# Patient Record
Sex: Male | Born: 1970 | Race: Black or African American | Hispanic: No | Marital: Married | Smoking: Never smoker
Health system: Southern US, Community
[De-identification: ages and names within clinical notes are randomized; demographics above are authoritative.]

## PROBLEM LIST (undated history)

## (undated) HISTORY — PX: HERNIA REPAIR: SHX51

---
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
## 2016-08-07 ENCOUNTER — Emergency Department (HOSPITAL_COMMUNITY)
Admission: EM | Admit: 2016-08-07 | Discharge: 2016-08-07 | Disposition: A | Discharge status: Home / Self Care | Payer: Self-pay | Attending: Emergency Medicine | Admitting: Emergency Medicine

## 2016-08-07 ENCOUNTER — Encounter (HOSPITAL_COMMUNITY): Payer: Self-pay | Admitting: Emergency Medicine

## 2016-08-07 ENCOUNTER — Emergency Department (HOSPITAL_COMMUNITY): Payer: Self-pay

## 2016-08-07 DIAGNOSIS — K4090 Unilateral inguinal hernia, without obstruction or gangrene, not specified as recurrent: Secondary | ICD-10-CM | POA: Insufficient documentation

## 2016-08-07 MED ORDER — FENTANYL CITRATE (PF) 100 MCG/2ML IJ SOLN
100.0000 ug | Freq: Once | INTRAMUSCULAR | Status: AC
  Administered 2016-08-07: 100 ug via INTRAVENOUS
  Filled 2016-08-07: qty 2

## 2016-08-07 MED ORDER — HYDROCODONE-ACETAMINOPHEN 5-325 MG PO TABS: 1.0000 | ORAL_TABLET | Freq: Four times a day (QID) | ORAL | 0 refills | Status: DC | PRN

## 2016-08-07 MED ORDER — IOPAMIDOL (ISOVUE-300) INJECTION 61%
INTRAVENOUS | Status: AC
  Administered 2016-08-07: 100 mL via INTRAVENOUS
  Filled 2016-08-07: qty 100

## 2016-08-07 MED ORDER — DOCUSATE SODIUM 100 MG PO CAPS: 100.0000 mg | ORAL_CAPSULE | Freq: Two times a day (BID) | ORAL | 0 refills | Status: DC

## 2016-08-07 NOTE — ED Triage Notes (Signed)
Pt complaining of right groin pain. Denies N/V/D. Pt states he had hernia surgery in 1987 on his left groin. He feels like the pain is the same as when he had a hernia. 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

## 2016-08-08 NOTE — ED Provider Notes (Signed)
[facility] DEPT Provider Note   CSN: [id number] Arrival date & time: 08/07/16  1839     History   Chief Complaint No chief complaint on file.   HPI Jonathan Norton is a 46 y.o. male.   Abdominal Pain   This is a new problem. The current episode started more than 2 days ago. The problem occurs constantly. Associated with: heavy lifting. The pain is located in the RLQ.    History reviewed. No pertinent past medical history.  There are no active problems to display for this patient.   Past Surgical History:  Procedure Laterality Date  . HERNIA REPAIR         Home Medications    Prior to Admission medications   Medication Sig Start Date End Date Taking? Authorizing Provider  docusate sodium (COLACE) 100 MG capsule Take 1 capsule (100 mg total) by mouth every 12 (twelve) hours. 08/07/16   Brooklinn Longbottom, Corene Cornea, MD  HYDROcodone-acetaminophen (NORCO/VICODIN) 5-325 MG tablet Take 1 tablet by mouth every 6 (six) hours as needed for severe pain. 08/07/16   Salayah Meares, Corene Cornea, MD    Family History History reviewed. No pertinent family history.  Social History Social History  Substance Use Topics  . Smoking status: Never Smoker  . Smokeless tobacco: Never Used  . Alcohol use No     Allergies   Patient has no known allergies.   Review of Systems Review of Systems  Gastrointestinal: Positive for abdominal pain.  All other systems reviewed and are negative.    Physical Exam Updated Vital Signs BP 125/70   Pulse 69   Temp 98.5 F (36.9 C) (Oral)   Resp 16   Ht 5\' 10"  (1.778 m)   SpO2 92%   Physical Exam  Constitutional: He is oriented to person, place, and time. He appears well-developed and well-nourished.  HENT:  Head: Normocephalic and atraumatic.  Eyes: Conjunctivae and EOM are normal.  Neck: Normal range of motion.  Cardiovascular: Normal rate.   Pulmonary/Chest: Effort normal. No respiratory distress.  Abdominal: Soft. He exhibits no distension.    Musculoskeletal: Normal range of motion. He exhibits no edema or deformity.  Neurological: He is alert and oriented to person, place, and time. No cranial nerve deficit. Coordination normal.  Nursing note and vitals reviewed.    ED Treatments / Results  Labs (all labs ordered are listed, but only abnormal results are displayed) Labs Reviewed - No data to display  EKG  EKG Interpretation None       Radiology Ct Abdomen Pelvis W Contrast  Result Date: 08/07/2016 CLINICAL DATA:  Acute onset of right groin pain.  Initial encounter. EXAM: CT ABDOMEN AND PELVIS WITH CONTRAST TECHNIQUE: Multidetector CT imaging of the abdomen and pelvis was performed using the standard protocol following bolus administration of intravenous contrast. CONTRAST:  172mL ISOVUE-300 IOPAMIDOL (ISOVUE-300) INJECTION 61% COMPARISON:  None. FINDINGS: Lower chest: A small left pleural effusion is noted. Mild left basilar airspace opacity could reflect mild pneumonia. The visualized portions of the thyroid gland are unremarkable. Hepatobiliary: The liver is unremarkable in appearance. The gallbladder is unremarkable in appearance. The common bile duct remains normal in caliber. Pancreas: The pancreas is within normal limits. Spleen: The spleen is unremarkable in appearance. Adrenals/Urinary Tract: The adrenal glands are unremarkable in appearance. The kidneys are within normal limits. There is no evidence of hydronephrosis. No renal or ureteral stones are identified. No perinephric stranding is seen. Stomach/Bowel: The stomach is unremarkable in appearance. The small bowel is within normal  limits. The appendix is normal in caliber, without evidence of appendicitis. The colon is unremarkable in appearance. Vascular/Lymphatic: The abdominal aorta is unremarkable in appearance. The inferior vena cava is grossly unremarkable. No retroperitoneal lymphadenopathy is seen. No pelvic sidewall lymphadenopathy is identified. Reproductive:  The bladder is mildly distended and grossly remarkable. The prostate remains normal in size. Other: A small right inguinal hernia is noted, containing only fat. Musculoskeletal: No acute osseous abnormalities are identified. The visualized musculature is unremarkable in appearance. IMPRESSION: 1. Small right inguinal hernia, containing only fat. 2. Small left pleural effusion noted. Mild left basilar airspace opacity could reflect mild pneumonia. Would correlate for any associated symptoms. Electronically Signed   By: Garald Balding M.D.   On: 08/07/2016 23:03    Procedures Procedures (including critical care time)  Medications Ordered in ED Medications  fentaNYL (SUBLIMAZE) injection 100 mcg (100 mcg Intravenous Given 08/07/16 2200)  iopamidol (ISOVUE-300) 61 % injection (100 mLs Intravenous Contrast Given 08/07/16 2237)     Initial Impression / Assessment and Plan / ED Course  I have reviewed the triage vital signs and the nursing notes.  Pertinent labs & imaging results that were available during my care of the patient were reviewed by me and considered in my medical decision making (see chart for details).     New right inguinal hernia, no concern for incarceration or obstruction currently. Will have surgery follow up, short course of pain meds until then.   Final Clinical Impressions(s) / ED Diagnoses   Final diagnoses:  Non-recurrent unilateral inguinal hernia without obstruction or gangrene    New Prescriptions Discharge Medication List as of 08/07/2016 11:14 PM    START taking these medications   Details  docusate sodium (COLACE) 100 MG capsule Take 1 capsule (100 mg total) by mouth every 12 (twelve) hours., Starting Mon 08/07/2016, Print    HYDROcodone-acetaminophen (NORCO/VICODIN) 5-325 MG tablet Take 1 tablet by mouth every 6 (six) hours as needed for severe pain., Starting Mon 08/07/2016, Print         Shanley Furlough, Corene Cornea, MD 08/08/16 0040

## 2018-10-20 IMAGING — CT CT ABD-PELV W/ CM
2 of 5 series · 16 of 46 positions shown, 18 images · IV contrast (iopamidol)
Comparison: None.

CLINICAL DATA: Acute onset of right groin pain.  Initial encounter.

EXAM:
CT ABDOMEN AND PELVIS WITH CONTRAST
TECHNIQUE: Multidetector CT imaging of the abdomen and pelvis was performed
using the standard protocol following bolus administration of
intravenous contrast.
CONTRAST:  100mL JKMGA9-0ZZ IOPAMIDOL (JKMGA9-0ZZ) INJECTION 61%

[Series 3: abd/ pelvis 5.0 i30f 2 · axial · 0.86mm/px · z∈[-410,+30]mm · 13 of 98 slices shown, 15 images]
[im 5/98  soft-tissue]
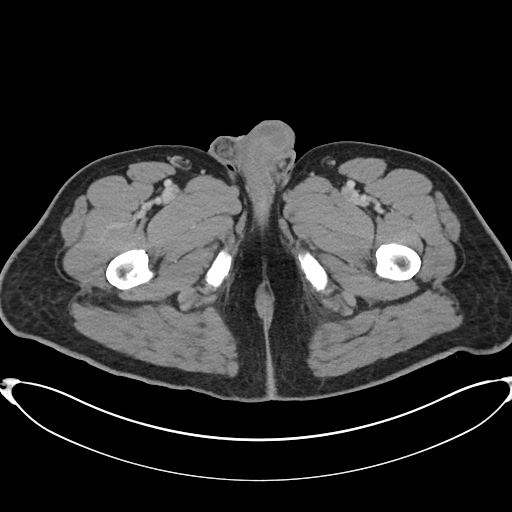
[im 5/98  bone]
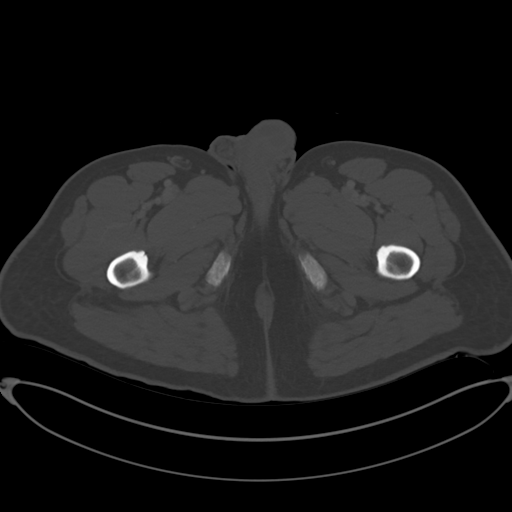
[im 14/98  soft-tissue]
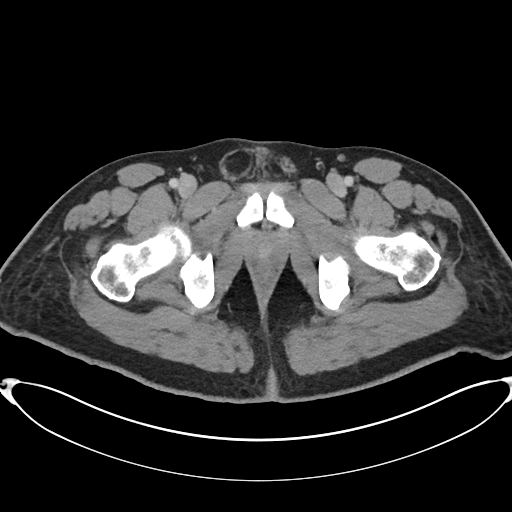
[im 23/98  soft-tissue]
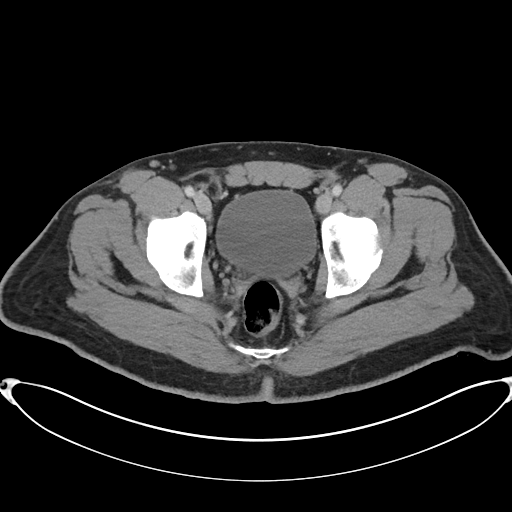
[im 27/98  soft-tissue]
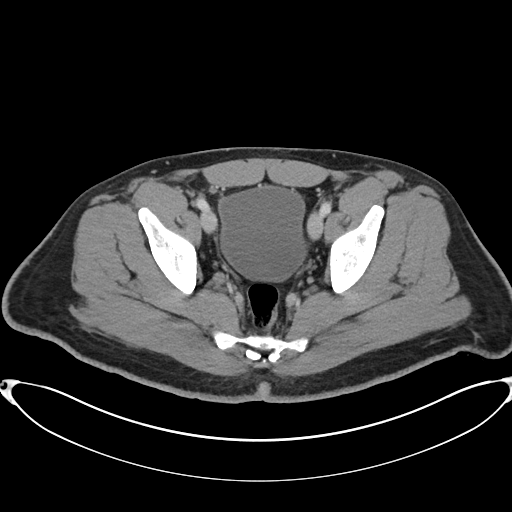
[im 36/98  soft-tissue]
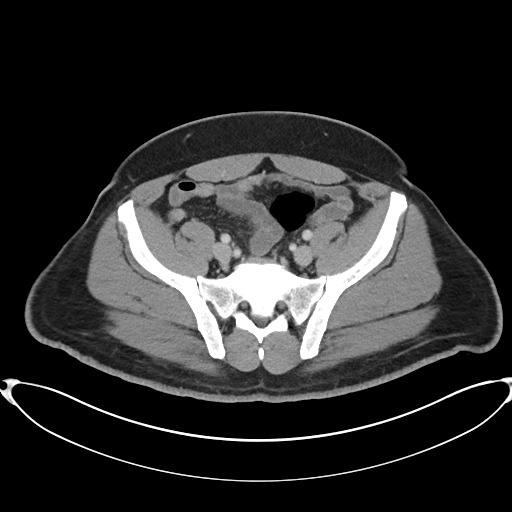
[im 40/98  soft-tissue]
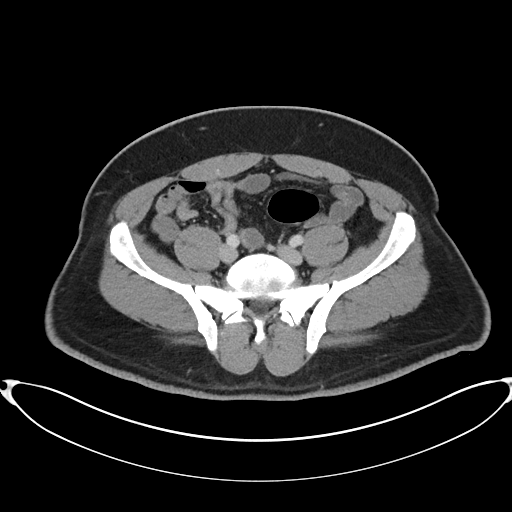
[im 49/98  soft-tissue]
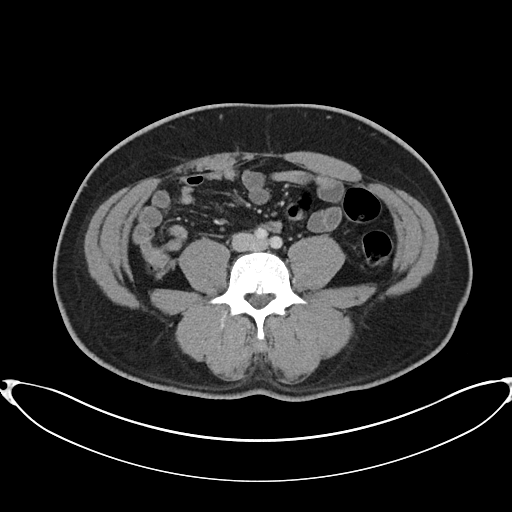
[im 58/98  soft-tissue]
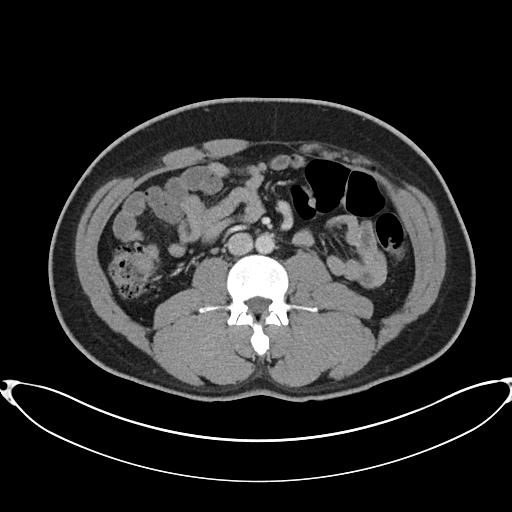
[im 62/98  soft-tissue]
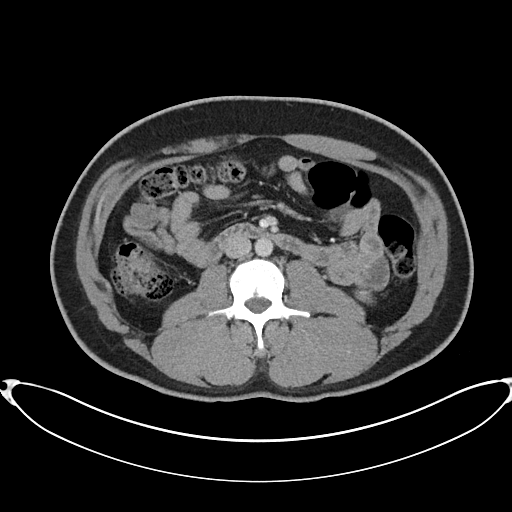
[im 62/98  bone]
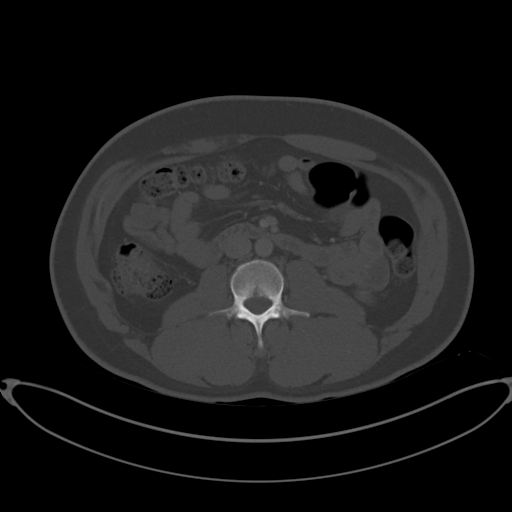
[im 71/98  soft-tissue]
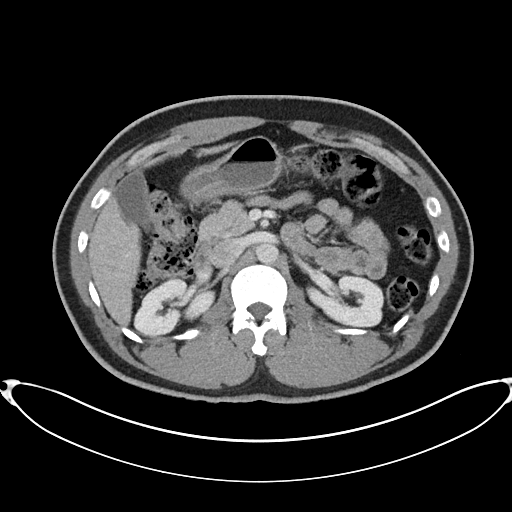
[im 75/98  soft-tissue]
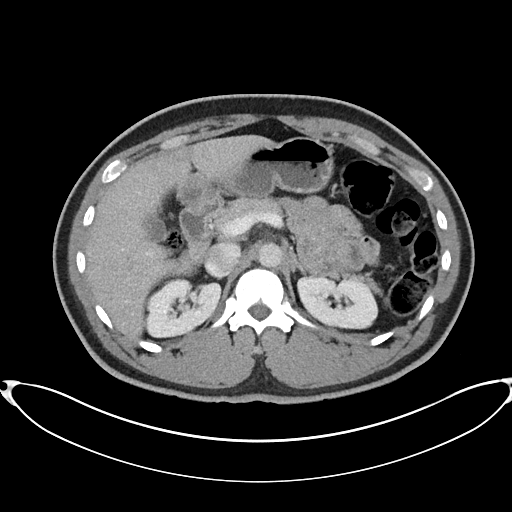
[im 84/98  soft-tissue]
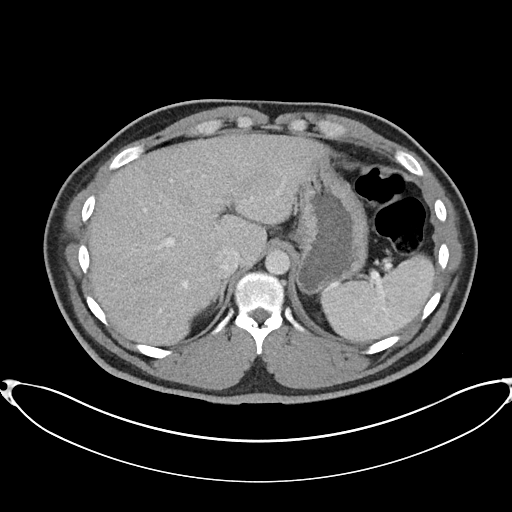
[im 93/98  soft-tissue]
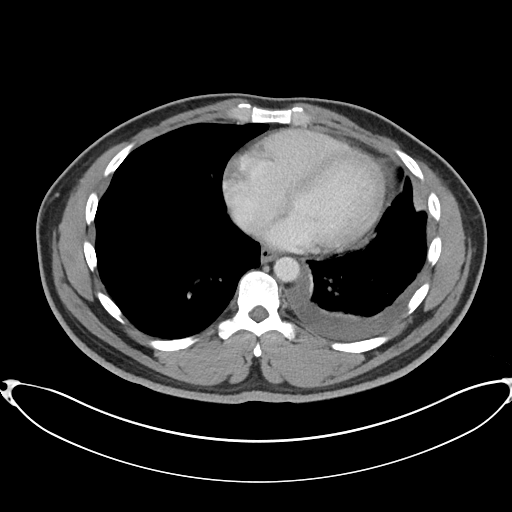

[Series 6: coronal soft tissue · coronal · 0.95mm/px · 3 of 98 slices shown]
[im 33/98  soft-tissue]
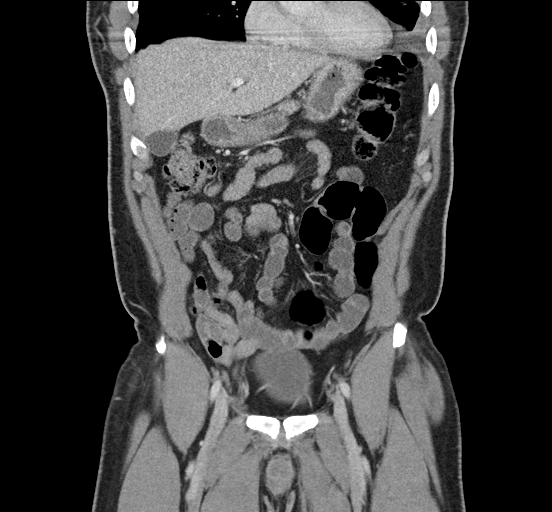
[im 44/98  soft-tissue]
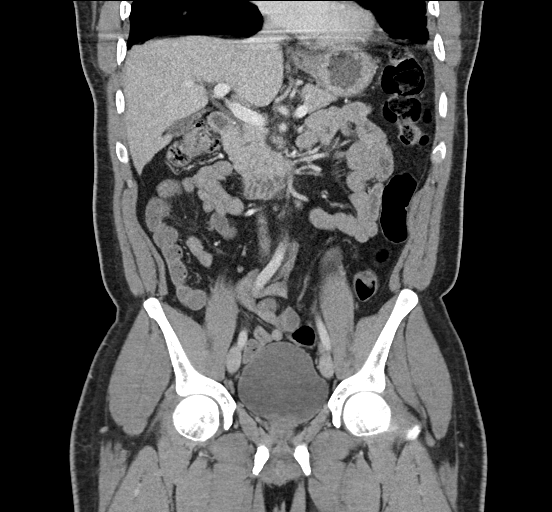
[im 54/98  soft-tissue]
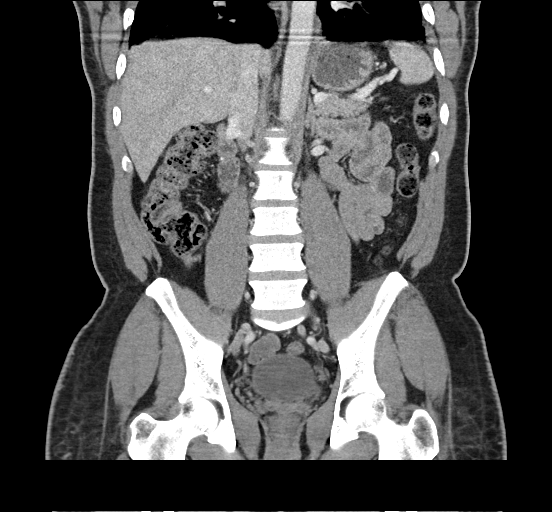

[16 of 46 positions shown; findings below may reference images not displayed]

FINDINGS: Lower chest: A small left pleural effusion is noted. Mild left
basilar airspace opacity could reflect mild pneumonia. The
visualized portions of the thyroid gland are unremarkable.

Hepatobiliary: The liver is unremarkable in appearance. The
gallbladder is unremarkable in appearance. The common bile duct
remains normal in caliber.

Pancreas: The pancreas is within normal limits.

Spleen: The spleen is unremarkable in appearance.

Adrenals/Urinary Tract: The adrenal glands are unremarkable in
appearance. The kidneys are within normal limits. There is no
evidence of hydronephrosis. No renal or ureteral stones are
identified. No perinephric stranding is seen.

Stomach/Bowel: The stomach is unremarkable in appearance. The small
bowel is within normal limits. The appendix is normal in caliber,
without evidence of appendicitis. The colon is unremarkable in
appearance.

Vascular/Lymphatic: The abdominal aorta is unremarkable in
appearance. The inferior vena cava is grossly unremarkable. No
retroperitoneal lymphadenopathy is seen. No pelvic sidewall
lymphadenopathy is identified.

Reproductive: The bladder is mildly distended and grossly
remarkable. The prostate remains normal in size.

Other: A small right inguinal hernia is noted, containing only fat.

Musculoskeletal: No acute osseous abnormalities are identified. The
visualized musculature is unremarkable in appearance.
IMPRESSION: 1. Small right inguinal hernia, containing only fat.
2. Small left pleural effusion noted. Mild left basilar airspace
opacity could reflect mild pneumonia. Would correlate for any
associated symptoms.

## 2019-01-03 ENCOUNTER — Other Ambulatory Visit: Payer: Self-pay

## 2019-01-03 DIAGNOSIS — Z20822 Contact with and (suspected) exposure to covid-19: Secondary | ICD-10-CM

## 2019-01-06 LAB — NOVEL CORONAVIRUS, NAA: SARS-CoV-2, NAA: NOT DETECTED

## 2019-01-31 ENCOUNTER — Other Ambulatory Visit: Payer: Self-pay | Admitting: Obstetrics and Gynecology

## 2019-01-31 ENCOUNTER — Ambulatory Visit
Admission: RE | Admit: 2019-01-31 | Discharge: 2019-01-31 | Disposition: A | Discharge status: Home / Self Care | Payer: No Typology Code available for payment source | Source: Ambulatory Visit | Attending: Obstetrics and Gynecology | Admitting: Obstetrics and Gynecology

## 2019-01-31 DIAGNOSIS — R7611 Nonspecific reaction to tuberculin skin test without active tuberculosis: Secondary | ICD-10-CM

## 2019-05-08 ENCOUNTER — Ambulatory Visit: Payer: No Typology Code available for payment source | Attending: Internal Medicine

## 2019-05-08 DIAGNOSIS — Z23 Encounter for immunization: Secondary | ICD-10-CM

## 2019-05-08 NOTE — Progress Notes (Signed)
   Covid-19 Vaccination Clinic  Name:  Jonathan Norton    MRN: [id number] DOB: 15-Oct-1970  05/08/2019  Jonathan Norton was observed post Covid-19 immunization for 15 minutes without incident. He was provided with Vaccine Information Sheet and instruction to access the V-Safe system.   Jonathan Norton was instructed to call 911 with any severe reactions post vaccine: Marland Kitchen Difficulty breathing  . Swelling of face and throat  . A fast heartbeat  . A bad rash all over body  . Dizziness and weakness   Immunizations Administered    Name Date Dose VIS Date Route   Pfizer COVID-19 Vaccine 05/08/2019  2:04 PM 0.3 mL 01/31/2019 Intramuscular   Manufacturer: [place]   Lot: [id number]   [place]: [id number]

## 2019-06-02 ENCOUNTER — Ambulatory Visit: Payer: No Typology Code available for payment source | Attending: Internal Medicine

## 2019-06-02 DIAGNOSIS — Z23 Encounter for immunization: Secondary | ICD-10-CM

## 2019-06-02 NOTE — Progress Notes (Signed)
   Covid-19 Vaccination Clinic  Name:  Jonathan Norton    MRN: [id number] DOB: 1970-12-23  06/02/2019  Mr. Jonathan Norton was observed post Covid-19 immunization for 15 minutes without incident. He was provided with Vaccine Information Sheet and instruction to access the V-Safe system.   Mr. Jonathan Norton was instructed to call 911 with any severe reactions post vaccine: Marland Kitchen Difficulty breathing  . Swelling of face and throat  . A fast heartbeat  . A bad rash all over body  . Dizziness and weakness   Immunizations Administered    Name Date Dose VIS Date Route   Pfizer COVID-19 Vaccine 06/02/2019  2:00 PM 0.3 mL 01/31/2019 Intramuscular   Manufacturer: [place]   Lot: [id number]   [place]: [id number]

## 2021-04-14 IMAGING — CR DG CHEST 2V
2 series · 2 of 2 positions shown · non-contrast
Comparison: None.

CLINICAL DATA: Positive PPD

EXAM:
CHEST - 2 VIEW

[w chest pa]
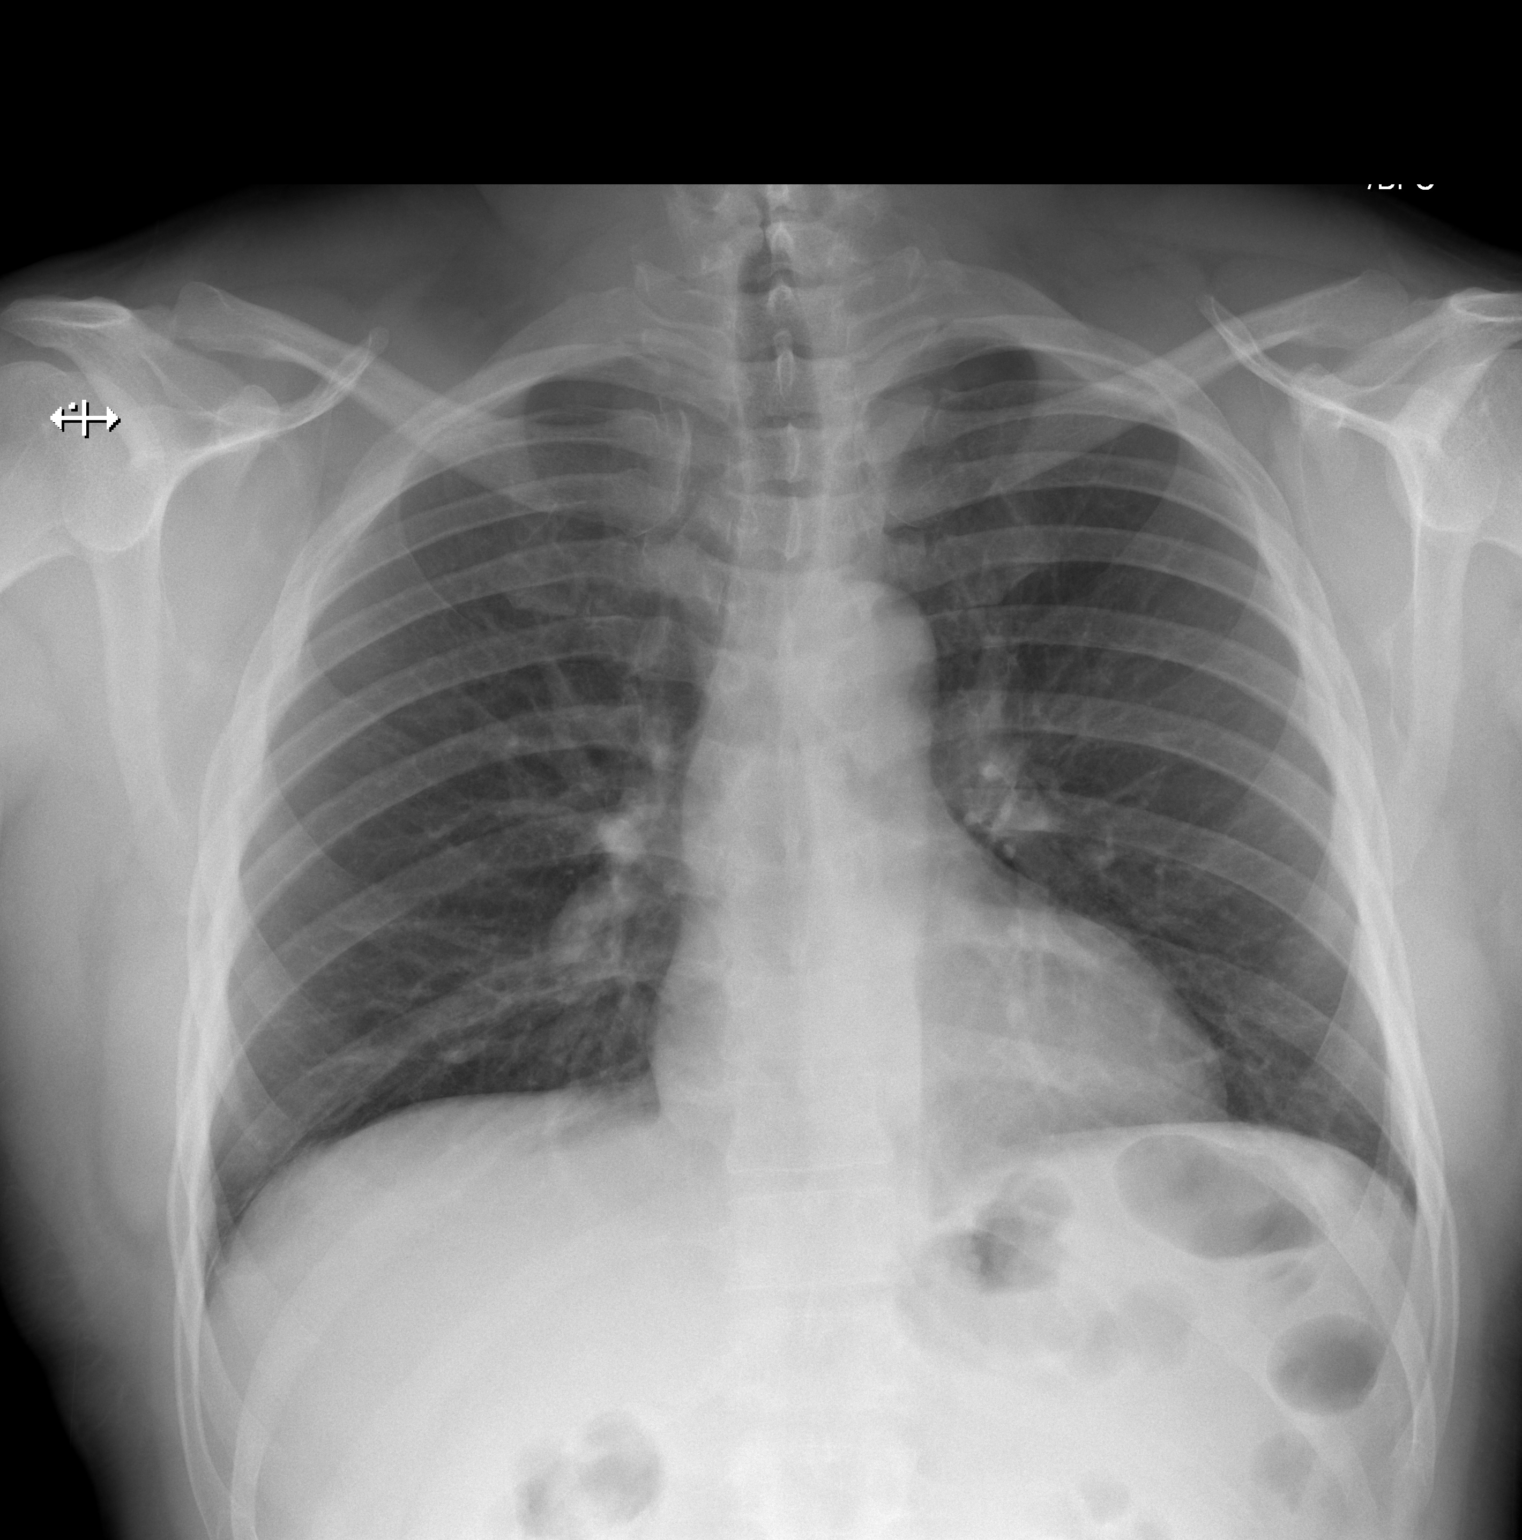

[w chest lat]
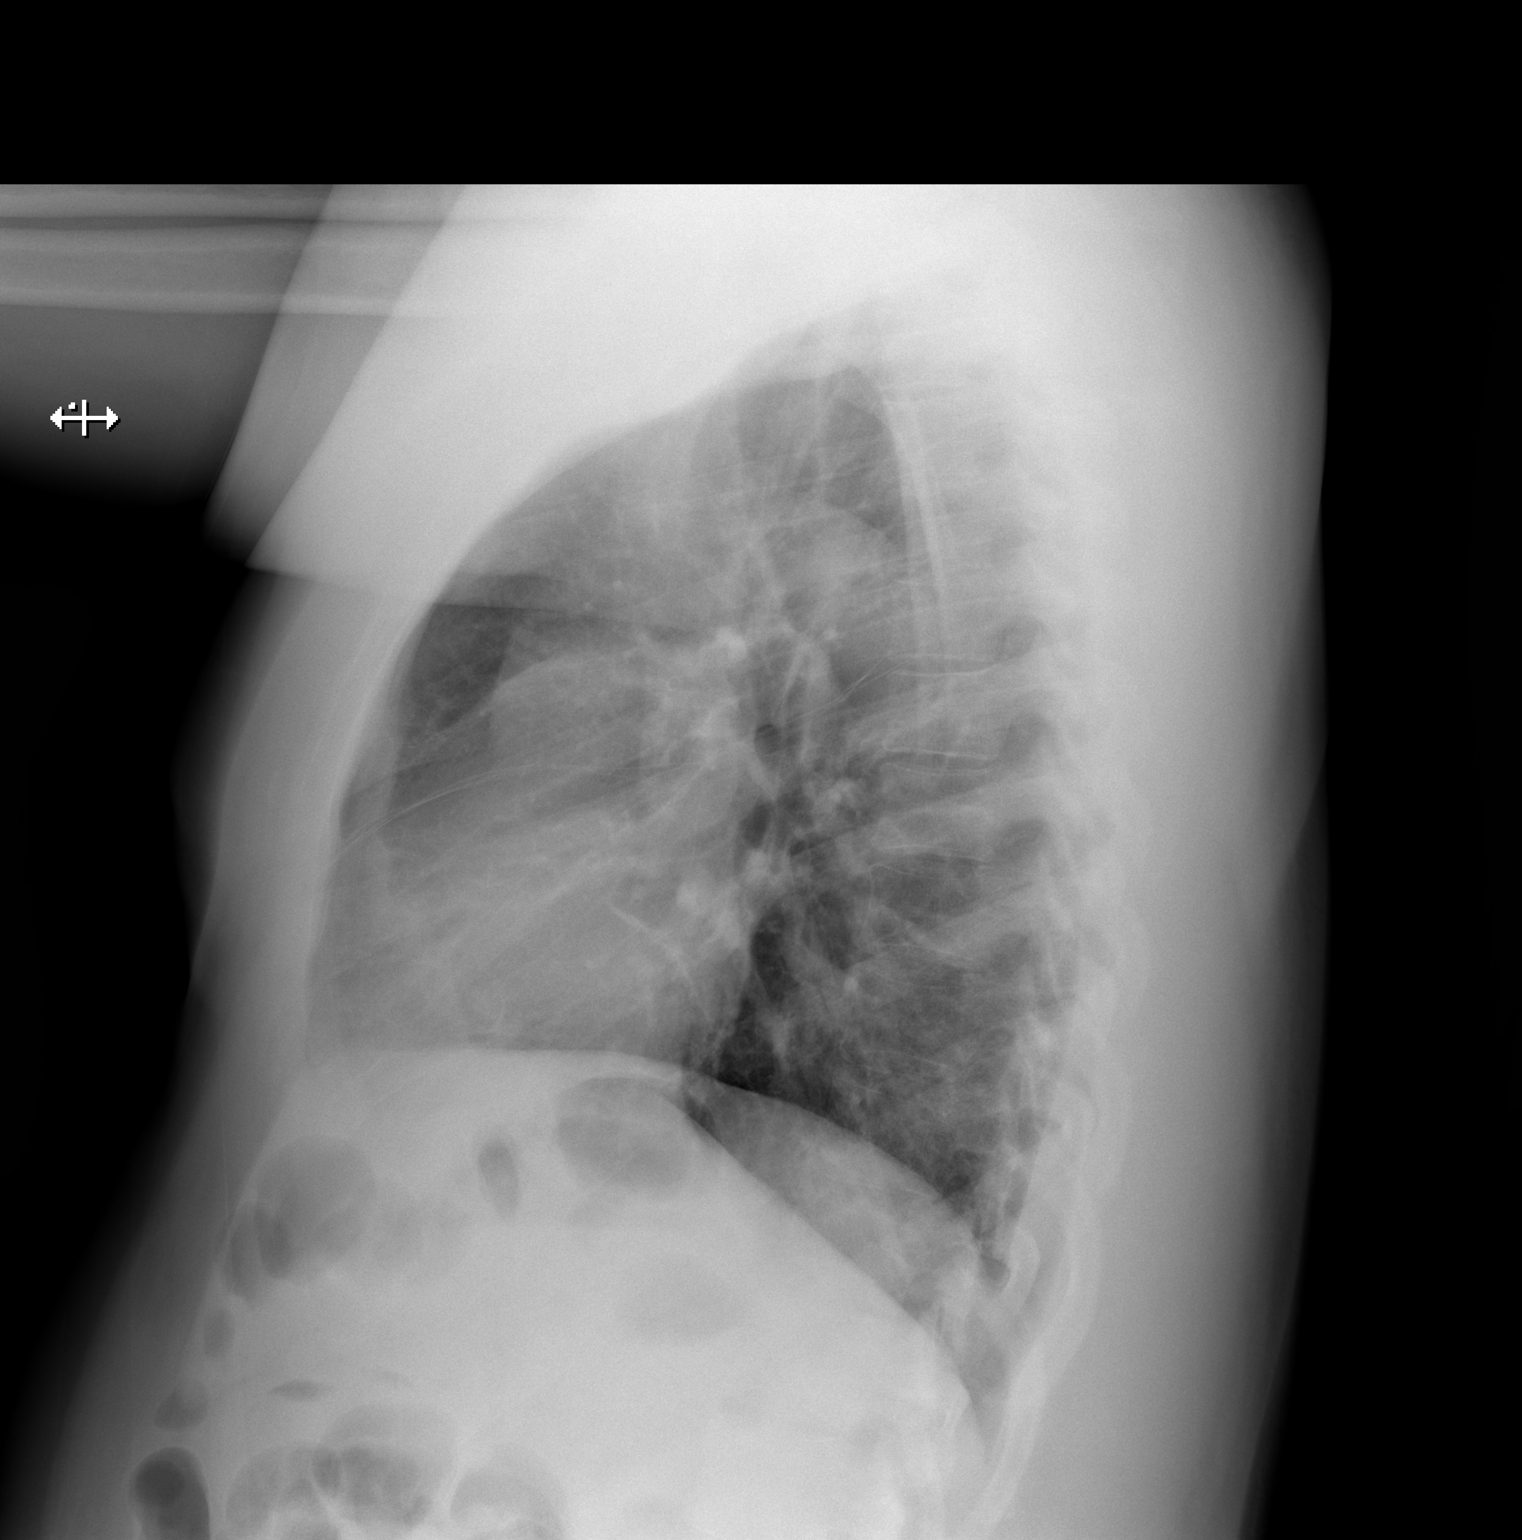

[2 of 2 positions shown; findings below may reference images not displayed]

FINDINGS: The heart size and mediastinal contours are within normal limits.
Both lungs are clear. The visualized skeletal structures are
unremarkable.
IMPRESSION: No acute abnormality of the lungs. No radiographic evidence of
pulmonary tuberculosis.

## 2021-05-05 ENCOUNTER — Encounter: Payer: Self-pay | Admitting: Gastroenterology

## 2021-05-23 ENCOUNTER — Ambulatory Visit (AMBULATORY_SURGERY_CENTER): Payer: 59

## 2021-05-23 ENCOUNTER — Other Ambulatory Visit: Payer: Self-pay

## 2021-05-23 VITALS — Ht 70.0 in | Wt 210.0 lb

## 2021-05-23 DIAGNOSIS — Z1211 Encounter for screening for malignant neoplasm of colon: Secondary | ICD-10-CM

## 2021-05-23 MED ORDER — PEG 3350-KCL-NA BICARB-NACL 420 G PO SOLR: 4000.0000 mL | Freq: Once | ORAL | 0 refills | Status: AC

## 2021-05-23 NOTE — Progress Notes (Signed)
No egg or soy allergy known to patient  ?No issues known to pt with past sedation with any surgeries or procedures ?Patient denies ever being told they had issues or difficulty with intubation  ?No FH of Malignant Hyperthermia ?Pt is not on diet pills ?Pt is not on  home 02  ?Pt is not on blood thinners  ?Pt denies issues with constipation;  ?No A fib or A flutter ?NO PA's for preps discussed with pt in PV today  ?Discussed with pt there will be an out-of-pocket cost for prep and that varies from $0 to 70 +  dollars - pt verbalized understanding  ?Due to the COVID-19 pandemic we are asking patients to follow certain guidelines in PV and the [facility]   ?Pt aware of COVID protocols and [facility] guidelines  ?PV completed over the phone. Pt verified name, DOB, address and insurance during PV today.  ?Pt mailed instruction packet with copy of consent form to read and not return, and instructions.  ?Pt encouraged to call with questions or issues.  ?If pt has My chart, procedure instructions sent via My Chart   ?

## 2021-05-25 ENCOUNTER — Encounter: Payer: Self-pay | Admitting: Gastroenterology

## 2021-06-06 ENCOUNTER — Ambulatory Visit (AMBULATORY_SURGERY_CENTER): Payer: 59 | Admitting: Gastroenterology

## 2021-06-06 ENCOUNTER — Encounter: Payer: Self-pay | Admitting: Gastroenterology

## 2021-06-06 VITALS — BP 111/63 | HR 81 | Temp 97.3°F | Resp 10 | Ht 70.0 in | Wt 210.0 lb

## 2021-06-06 DIAGNOSIS — Z1211 Encounter for screening for malignant neoplasm of colon: Secondary | ICD-10-CM | POA: Diagnosis present

## 2021-06-06 DIAGNOSIS — D124 Benign neoplasm of descending colon: Secondary | ICD-10-CM | POA: Diagnosis not present

## 2021-06-06 MED ORDER — SODIUM CHLORIDE 0.9 % IV SOLN: 500.0000 mL | Freq: Once | INTRAVENOUS | Status: DC

## 2021-06-06 NOTE — Patient Instructions (Signed)
Discharge instructions given. ?Handouts on polyps,diverticulosis and hemorrhoids. ?Resume previous medications. ?YOU HAD AN ENDOSCOPIC PROCEDURE TODAY AT [facility]:   Refer to the procedure report that was given to you for any specific questions about what was found during the examination.  If the procedure report does not answer your questions, please call your gastroenterologist to clarify.  If you requested that your care partner not be given the details of your procedure findings, then the procedure report has been included in a sealed envelope for you to review at your convenience later. ? ?YOU SHOULD EXPECT: Some feelings of bloating in the abdomen. Passage of more gas than usual.  Walking can help get rid of the air that was put into your GI tract during the procedure and reduce the bloating. If you had a lower endoscopy (such as a colonoscopy or flexible sigmoidoscopy) you may notice spotting of blood in your stool or on the toilet paper. If you underwent a bowel prep for your procedure, you may not have a normal bowel movement for a few days. ? ?Please Note:  You might notice some irritation and congestion in your nose or some drainage.  This is from the oxygen used during your procedure.  There is no need for concern and it should clear up in a day or so. ? ?SYMPTOMS TO REPORT IMMEDIATELY: ? ?Following lower endoscopy (colonoscopy or flexible sigmoidoscopy): ? Excessive amounts of blood in the stool ? Significant tenderness or worsening of abdominal pains ? Swelling of the abdomen that is new, acute ? Fever of 100?F or higher ? ? ? ?For urgent or emergent issues, a gastroenterologist can be reached at any hour by calling [phone]. ?Do not use MyChart messaging for urgent concerns.  ? ? ?DIET:  We do recommend a small meal at first, but then you may proceed to your regular diet.  Drink plenty of fluids but you should avoid alcoholic beverages for 24 hours. ? ?ACTIVITY:  You should  plan to take it easy for the rest of today and you should NOT DRIVE or use heavy machinery until tomorrow (because of the sedation medicines used during the test).   ? ?FOLLOW UP: ?Our staff will call the number listed on your records 48-72 hours following your procedure to check on you and address any questions or concerns that you may have regarding the information given to you following your procedure. If we do not reach you, we will leave a message.  We will attempt to reach you two times.  During this call, we will ask if you have developed any symptoms of COVID 19. If you develop any symptoms (ie: fever, flu-like symptoms, shortness of breath, cough etc.) before then, please call [phone].  If you test positive for Covid 19 in the 2 weeks post procedure, please call and report this information to [place].   ? ?If any biopsies were taken you will be contacted by phone or by letter within the next 1-3 weeks.  Please call us at [phone] if you have not heard about the biopsies in 3 weeks.  ? ? ?SIGNATURES/CONFIDENTIALITY: ?You and/or your care partner have signed paperwork which will be entered into your electronic medical record.  These signatures attest to the fact that that the information above on your After Visit Summary has been reviewed and is understood.  Full responsibility of the confidentiality of this discharge information lies with you and/or your care-partner.  ?

## 2021-06-06 NOTE — Progress Notes (Signed)
VS  DT ? ?Pt's states no medical or surgical changes since previsit or office visit. ? ?

## 2021-06-06 NOTE — Progress Notes (Signed)
[facility] History and Physical ? ? ?Primary Care Physician:  Patient, No Pcp Per (Inactive) ? ? ?Reason for Procedure:   Colon cancer screening ? ?Plan:    Screening colonoscopy ? ? ? ? ?HPI: Landrum Carbonell is a 51 y.o. male undergoing initial average risk screening colonoscopy.  He has no family history of colon cancer and no chronic GI symptoms.  ? ? ?History reviewed. No pertinent past medical history. ? ?Past Surgical History:  ?Procedure Laterality Date  ? INGUINAL HERNIA REPAIR Left 1988  ? ? ?Prior to Admission medications   ?Not on File  ? ? ?No current outpatient medications on file.  ? ?Current Facility-Administered Medications  ?Medication Dose Route Frequency Provider Last Rate Last Admin  ? 0.9 %  sodium chloride infusion  500 mL Intravenous Once Daryel November, MD      ? ? ?Allergies as of 06/06/2021  ? (No Known Allergies)  ? ? ?Family History  ?Problem Relation Age of Onset  ? Diabetes Mother   ? Hypertension Mother   ? Colon cancer Neg Hx   ? Colon polyps Neg Hx   ? Rectal cancer Neg Hx   ? Stomach cancer Neg Hx   ? Esophageal cancer Neg Hx   ? ? ?Social History  ? ?Socioeconomic History  ? Marital status: Married  ?  Spouse name: Not on file  ? Number of children: Not on file  ? Years of education: Not on file  ? Highest education level: Not on file  ?Occupational History  ? Not on file  ?Tobacco Use  ? Smoking status: Never  ? Smokeless tobacco: Never  ?Vaping Use  ? Vaping Use: Never used  ?Substance and Sexual Activity  ? Alcohol use: No  ? Drug use: No  ? Sexual activity: Not on file  ?Other Topics Concern  ? Not on file  ?Social History Narrative  ? Not on file  ? ?Social Determinants of Health  ? ?Financial Resource Strain: Not on file  ?Food Insecurity: Not on file  ?Transportation Needs: Not on file  ?Physical Activity: Not on file  ?Stress: Not on file  ?Social Connections: Not on file  ?Intimate Partner Violence: Not on file  ? ? ?Review of Systems: ? ?All other review  of systems negative except as mentioned in the HPI. ? ?Physical Exam: ?Vital signs ?BP 134/83   Pulse 67   Temp (!) 97.3 ?F (36.3 ?C)   Ht '5\' 10"'$  (1.778 m)   Wt 210 lb (95.3 kg)   SpO2 97%   BMI 30.13 kg/m?  ? ?General:   Alert,  Well-developed, well-nourished, pleasant and cooperative in NAD ?Airway:  Mallampati 2 ?Lungs:  Clear throughout to auscultation.   ?Heart:  Regular rate and rhythm; no murmurs, clicks, rubs,  or gallops. ?Abdomen:  Soft, nontender and nondistended. Normal bowel sounds.   ?Neuro/Psych:  Normal mood and affect. A and O x 3 ? ? ?Tanette Chauca E. Candis Schatz, MD ?[facility] ? ?

## 2021-06-06 NOTE — Op Note (Signed)
[facility] ?Patient Name: Jonathan Norton ?Procedure Date: 06/06/2021 1:44 PM ?MRN: [id number] ?Endoscopist: Laurel Harnden E. Candis Schatz , MD ?Age: 51 ?Referring MD:  ?Date of Birth: October 06, 1970 ?Gender: Male ?Account #: [id number] ?Procedure:                Colonoscopy ?Indications:              Screening for colorectal malignant neoplasm, This  ?                          is the patient's first colonoscopy ?Medicines:                Monitored Anesthesia Care ?Procedure:                Pre-Anesthesia Assessment: ?                          - Prior to the procedure, a History and Physical  ?                          was performed, and patient medications and  ?                          allergies were reviewed. The patient's tolerance of  ?                          previous anesthesia was also reviewed. The risks  ?                          and benefits of the procedure and the sedation  ?                          options and risks were discussed with the patient.  ?                          All questions were answered, and informed consent  ?                          was obtained. Prior Anticoagulants: The patient has  ?                          taken no previous anticoagulant or antiplatelet  ?                          agents. ASA Grade Assessment: II - A patient with  ?                          mild systemic disease. After reviewing the risks  ?                          and benefits, the patient was deemed in  ?                          satisfactory condition to undergo the procedure. ?  After obtaining informed consent, the colonoscope  ?                          was passed under direct vision. Throughout the  ?                          procedure, the patient's blood pressure, pulse, and  ?                          oxygen saturations were monitored continuously. The  ?                          Olympus CF-HQ190L (Serial# [id number]) Colonoscope was  ?                          introduced through the  anus and advanced to the the  ?                          cecum, identified by appendiceal orifice and  ?                          ileocecal valve. The colonoscopy was somewhat  ?                          difficult due to significant looping. Successful  ?                          completion of the procedure was aided by using  ?                          manual pressure. The patient tolerated the  ?                          procedure well. The quality of the bowel  ?                          preparation was good. The ileocecal valve,  ?                          appendiceal orifice, and rectum were photographed.  ?                          The bowel preparation used was GoLYTELY via split  ?                          dose instruction. ?Scope In: 2:09:49 PM ?Scope Out: 2:27:03 PM ?Scope Withdrawal Time: 0 hours 10 minutes 20 seconds  ?Total Procedure Duration: 0 hours 17 minutes 14 seconds  ?Findings:                 The perianal and digital rectal examinations were  ?                          normal. Pertinent negatives include normal  ?  sphincter tone and no palpable rectal lesions. ?                          A 4 mm polyp was found in the descending colon. The  ?                          polyp was sessile. The polyp was removed with a  ?                          cold snare. Resection and retrieval were complete. ?                          Multiple small and large-mouthed diverticula were  ?                          found in the ascending colon. ?                          The exam was otherwise normal throughout the  ?                          examined colon. ?                          Non-bleeding internal hemorrhoids were found during  ?                          retroflexion. The hemorrhoids were Grade I  ?                          (internal hemorrhoids that do not prolapse). ?                          No additional abnormalities were found on  ?                           retroflexion. ?Complications:            No immediate complications. ?Estimated Blood Loss:     Estimated blood loss was minimal. ?Impression:               - One 4 mm polyp in the descending colon, removed  ?                          with a cold snare. Resected and retrieved. ?                          - Diverticulosis in the ascending colon. ?                          - Non-bleeding internal hemorrhoids. ?Recommendation:           - Patient has a contact number available for  ?                          emergencies. The signs and symptoms of potential  ?  delayed complications were discussed with the  ?                          patient. Return to normal activities tomorrow.  ?                          Written discharge instructions were provided to the  ?                          patient. ?                          - Resume previous diet. ?                          - Continue present medications. ?                          - Await pathology results. ?                          - Repeat colonoscopy date to be determined after  ?                          pending pathology results are reviewed for  ?                          surveillance based on pathology results. ?Trequan Marsolek E. Candis Schatz, MD ?06/06/2021 2:33:42 PM ?This report has been signed electronically. ?

## 2021-06-06 NOTE — Progress Notes (Signed)
PT taken to PACU. Monitors in place. VSS. Report given to RN. 

## 2021-06-06 NOTE — Progress Notes (Signed)
Called to room to assist during endoscopic procedure.  Patient ID and intended procedure confirmed with present staff. Received instructions for my participation in the procedure from the performing physician.  

## 2021-06-08 ENCOUNTER — Telehealth: Payer: Self-pay

## 2021-06-08 NOTE — Telephone Encounter (Signed)
?  Follow up Call- ? ? ?  06/06/2021  ?  1:33 PM  ?Call back number  ?Post procedure Call Back phone  # [phone]  ?Permission to leave phone message Yes  ?  ? ?Patient questions: ? ?Do you have a fever, pain , or abdominal swelling? No. ?Pain Score  0 * ? ?Have you tolerated food without any problems? Yes.   ? ?Have you been able to return to your normal activities? Yes.   ? ?Do you have any questions about your discharge instructions: ?Diet   No. ?Medications  No. ?Follow up visit  No. ? ?Do you have questions or concerns about your Care? No. ? ?Actions: ?* If pain score is 4 or above: ?No action needed, pain <4. ? ? ?

## 2021-06-09 NOTE — Progress Notes (Signed)
Mr. Jonathan Norton,  ?The polyp which I removed during your recent procedure was proven to be completely benign but is considered a "pre-cancerous" polyp that MAY have grown into cancer if it had not been removed.  Studies shows that at least 20% of women over age 53 and 30% of men over age 93 have pre-cancerous polyps.  Based on current nationally recognized surveillance guidelines, I recommend that you have a repeat colonoscopy in 7 years.  ? ?If you develop any new rectal bleeding, abdominal pain or significant bowel habit changes, please contact me before then. ? ?

## 2021-09-27 DIAGNOSIS — E7800 Pure hypercholesterolemia, unspecified: Secondary | ICD-10-CM | POA: Diagnosis not present

## 2021-09-27 DIAGNOSIS — Z9189 Other specified personal risk factors, not elsewhere classified: Secondary | ICD-10-CM | POA: Diagnosis not present

## 2021-09-27 DIAGNOSIS — E6609 Other obesity due to excess calories: Secondary | ICD-10-CM | POA: Diagnosis not present

## 2021-09-27 DIAGNOSIS — J40 Bronchitis, not specified as acute or chronic: Secondary | ICD-10-CM | POA: Diagnosis not present

## 2021-11-01 DIAGNOSIS — E7800 Pure hypercholesterolemia, unspecified: Secondary | ICD-10-CM | POA: Diagnosis not present

## 2021-11-01 DIAGNOSIS — E6609 Other obesity due to excess calories: Secondary | ICD-10-CM | POA: Diagnosis not present

## 2021-11-01 DIAGNOSIS — Z9189 Other specified personal risk factors, not elsewhere classified: Secondary | ICD-10-CM | POA: Diagnosis not present

## 2021-11-21 DIAGNOSIS — Z23 Encounter for immunization: Secondary | ICD-10-CM | POA: Diagnosis not present

## 2022-01-11 DIAGNOSIS — E6609 Other obesity due to excess calories: Secondary | ICD-10-CM | POA: Diagnosis not present

## 2022-01-11 DIAGNOSIS — E7800 Pure hypercholesterolemia, unspecified: Secondary | ICD-10-CM | POA: Diagnosis not present

## 2022-01-11 DIAGNOSIS — Z9189 Other specified personal risk factors, not elsewhere classified: Secondary | ICD-10-CM | POA: Diagnosis not present

## 2022-01-11 DIAGNOSIS — J0100 Acute maxillary sinusitis, unspecified: Secondary | ICD-10-CM | POA: Diagnosis not present

## 2022-02-13 ENCOUNTER — Emergency Department (HOSPITAL_COMMUNITY)
Admission: EM | Admit: 2022-02-13 | Discharge: 2022-02-13 | Disposition: A | Discharge status: Home / Self Care | Payer: Self-pay | Attending: Emergency Medicine | Admitting: Emergency Medicine

## 2022-02-13 ENCOUNTER — Encounter (HOSPITAL_COMMUNITY): Payer: Self-pay

## 2022-02-13 ENCOUNTER — Other Ambulatory Visit: Payer: Self-pay

## 2022-02-13 DIAGNOSIS — K047 Periapical abscess without sinus: Secondary | ICD-10-CM | POA: Insufficient documentation

## 2022-02-13 MED ORDER — AMOXICILLIN-POT CLAVULANATE 875-125 MG PO TABS: 1.0000 | ORAL_TABLET | Freq: Two times a day (BID) | ORAL | 0 refills | Status: AC

## 2022-02-13 MED ORDER — IBUPROFEN 800 MG PO TABS
800.0000 mg | ORAL_TABLET | Freq: Once | ORAL | Status: AC
  Administered 2022-02-13: 800 mg via ORAL
  Filled 2022-02-13: qty 1

## 2022-02-13 MED ORDER — AMOXICILLIN-POT CLAVULANATE 875-125 MG PO TABS
1.0000 | ORAL_TABLET | Freq: Once | ORAL | Status: AC
  Administered 2022-02-13: 1 via ORAL
  Filled 2022-02-13: qty 1

## 2022-02-13 NOTE — ED Provider Notes (Addendum)
[place] EMERGENCY DEPARTMENT Provider Note   CSN: [id number] Arrival date & time: 02/13/22  1310     History  Chief Complaint  Patient presents with   Dental Pain    Jonathan Norton is a 51 y.o. male presents to the ED complaining of upper and lower right-sided dental pain for the past 3 days.  Patient states the pain and swelling is rating to his face and right ear.  Denies fever, chills, trouble swallowing, neck swelling, broken dentition.       Home Medications Prior to Admission medications   Medication Sig Start Date End Date Taking? Authorizing Provider  amoxicillin-clavulanate (AUGMENTIN) 875-125 MG tablet Take 1 tablet by mouth every 12 (twelve) hours. 02/13/22  Yes Theressa Stamps R, PA      Allergies    Patient has no known allergies.    Review of Systems   Review of Systems  Constitutional:  Negative for chills and fever.  HENT:  Positive for ear pain. Negative for mouth sores, sore throat and trouble swallowing.        Dental pain on the right side    Physical Exam Updated Vital Signs BP (!) 137/98 (BP Location: Right Arm)   Pulse 71   Temp 98.4 F (36.9 C) (Oral)   Resp 16   Ht '5\' 10"'$  (1.778 m)   SpO2 97%   BMI 30.13 kg/m  Physical Exam Vitals and nursing note reviewed.  Constitutional:      General: He is not in acute distress.    Appearance: Normal appearance. He is not ill-appearing or diaphoretic.  HENT:     Head: Normocephalic.     Right Ear: Tympanic membrane and ear canal normal.     Left Ear: Tympanic membrane and ear canal normal.     Mouth/Throat:     Mouth: Mucous membranes are moist. No oral lesions.     Dentition: Dental tenderness, gingival swelling, dental caries, dental abscesses and gum lesions present.     Tongue: No lesions.     Pharynx: Oropharynx is clear. No oropharyngeal exudate or posterior oropharyngeal erythema.     Comments: Redness and swelling to the gingiva near teeth 2 and 3 with suspected  periapical abscess.  Not currently draining, no bleeding.  Minimal swelling to patient's right face.  No broken dentition.  Dental caries are present.   Eyes:     General: Lids are normal.     Extraocular Movements: Extraocular movements intact.  Neck:     Trachea: Trachea and phonation normal.     Comments: No submandibular swelling to suggest Ludwig's angina.  Patient is able to swallow without difficulty.  No cervical lymphadenopathy. Cardiovascular:     Rate and Rhythm: Normal rate and regular rhythm.  Pulmonary:     Effort: Pulmonary effort is normal.  Musculoskeletal:     Cervical back: Full passive range of motion without pain. No edema. Normal range of motion.  Skin:    General: Skin is warm and dry.     Capillary Refill: Capillary refill takes less than 2 seconds.  Neurological:     Mental Status: He is alert. Mental status is at baseline.  Psychiatric:        Mood and Affect: Mood normal.        Behavior: Behavior normal.     ED Results / Procedures / Treatments   Labs (all labs ordered are listed, but only abnormal results are displayed) Labs Reviewed - No data to  display  EKG None  Radiology No results found.  Procedures Procedures    Medications Ordered in ED Medications  amoxicillin-clavulanate (AUGMENTIN) 875-125 MG per tablet 1 tablet (has no administration in time range)  ibuprofen (ADVIL) tablet 800 mg (has no administration in time range)    ED Course/ Medical Decision Making/ A&P                           Medical Decision Making Risk Prescription drug management.   This patient presents to the ED with chief complaint(s) of right side dental pain with associated facial swelling, pain, and otalgia.   The differential diagnosis includes periapical abscess, periodontal abscess, pulpitis; low suspicion for Ludwig angina based on patient's symptoms, no neck swelling or change in voice  The initial plan is to prescribe patient  antibiotics  Additional history obtained: None  Initial Assessment:   Exam significant for gingival redness and swelling to the area around tooth 2 and 3.  There appears to be what I suspect is a periapical abscess.  No active draining or bleeding.  There is a gingival redness and swelling to the lower right dentition as well.  No broken dentition, but there are dental caries.  No oral lesions.  Independent ECG/labs interpretation:  The following labs were independently interpreted:  Not indicated  Independent visualization and interpretation of imaging: I independently visualized the following imaging with scope of interpretation limited to determining acute life threatening conditions related to emergency care: Not indicated  Treatment and Reassessment: Gave patient first dose of antibiotics as well as pain medicine while in ED.  Will discharge patient home with course of antibiotics and dental referral.  Disposition:   The patient has been appropriately medically screened and/or stabilized in the ED. I have low suspicion for any other emergent medical condition which would require further screening, evaluation or treatment in the ED or require inpatient management. At time of discharge the patient is hemodynamically stable and in no acute distress. I have discussed work-up results and diagnosis with patient and answered all questions. Patient is agreeable with discharge plan. We discussed strict return precautions for returning to the emergency department and they verbalized understanding.  Will send patient home on 7 days of Augmentin and recommend taking ibuprofen for inflammation and pain control.  Provided patient with dental referral          Final Clinical Impression(s) / ED Diagnoses Final diagnoses:  Dental abscess    Rx / DC Orders ED Discharge Orders          Ordered    amoxicillin-clavulanate (AUGMENTIN) 875-125 MG tablet  Every 12 hours        02/13/22 1609               Pat Kocher, [place] 02/13/22 1609    Pat Kocher, [place] 02/13/22 1615    Godfrey Pick, MD 02/13/22 2048

## 2022-02-13 NOTE — Discharge Instructions (Addendum)
I have prescribed you an antibiotic to take for the next 7 days to treat a dental abscess.  I highly recommend scheduling appointment with a dentist as soon as possible for treatment.  I also recommend taking 800 mg of ibuprofen every 6-8 hours as needed for dental pain.  You may use cool compresses to the side of your face for any swelling.  Return to the ED if you develop any worsening of your symptoms or have any new concerns.  I have provided a couple of different phone numbers for dentistry practices in the area.

## 2022-02-13 NOTE — ED Triage Notes (Signed)
PT c/o right lower and upper dental pain for the past 3 days. States pain and swelling is radiating to face and right ear.

## 2022-05-09 DIAGNOSIS — Z0000 Encounter for general adult medical examination without abnormal findings: Secondary | ICD-10-CM | POA: Diagnosis not present

## 2022-05-09 DIAGNOSIS — E6609 Other obesity due to excess calories: Secondary | ICD-10-CM | POA: Diagnosis not present

## 2022-05-09 DIAGNOSIS — Z9189 Other specified personal risk factors, not elsewhere classified: Secondary | ICD-10-CM | POA: Diagnosis not present

## 2022-05-09 DIAGNOSIS — E7800 Pure hypercholesterolemia, unspecified: Secondary | ICD-10-CM | POA: Diagnosis not present

## 2022-05-09 DIAGNOSIS — Z131 Encounter for screening for diabetes mellitus: Secondary | ICD-10-CM | POA: Diagnosis not present

## 2022-05-09 DIAGNOSIS — Z125 Encounter for screening for malignant neoplasm of prostate: Secondary | ICD-10-CM | POA: Diagnosis not present

## 2022-06-06 ENCOUNTER — Ambulatory Visit (INDEPENDENT_AMBULATORY_CARE_PROVIDER_SITE_OTHER): Payer: 59 | Admitting: Nurse Practitioner

## 2022-06-06 ENCOUNTER — Encounter: Payer: Self-pay | Admitting: Nurse Practitioner

## 2022-06-06 VITALS — BP 144/82 | HR 93 | Temp 98.9°F | Ht 70.0 in | Wt 246.8 lb

## 2022-06-06 DIAGNOSIS — R0683 Snoring: Secondary | ICD-10-CM | POA: Diagnosis not present

## 2022-06-06 DIAGNOSIS — G4710 Hypersomnia, unspecified: Secondary | ICD-10-CM

## 2022-06-06 DIAGNOSIS — E669 Obesity, unspecified: Secondary | ICD-10-CM | POA: Diagnosis not present

## 2022-06-06 NOTE — Progress Notes (Unsigned)
  ID: Jonathan Norton, male    DOB: 1970-07-27, 52 y.o.   MRN: [id number]  No chief complaint on file.   Referring provider: Norm Salt, PA  HPI: 52 year old male, never smoker referred for sleep consult.  No significant past medical history.  TEST/EVENTS: '  06/06/2022: Today-sleep consult Patient presents today for sleep consult.  He has had trouble with his sleep for many years.  He has loud snoring at night, which sometimes awakens him.  He feels like he has restless sleep and tends to wake up tired in the morning.  He feels fatigued some days.  Wakes often throughout the night.  Denies any witnessed apneas, morning headaches, drowsy driving, sleep parasomnia/paralysis.  No history of narcolepsy or symptoms of cataplexy. He goes to bed around 11 PM.  Falls asleep within 20 minutes.  Wakes 4 times at night.  Gets up around 6:30 AM.  No sleep aids.  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  Weight has been stable over the last 2 years.  Never had a previous sleep study.  No significant past medical history He is a never smoker.  Does not drink any alcohol.  Never used drugs.  Occasional caffeine intake with coffee in the morning a few times a week.  He lives with his wife.  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  No significant family history.  Epworth 2  No Known Allergies  Immunization History  Administered Date(s) Administered   PFIZER(Purple Top)SARS-COV-2 Vaccination 05/08/2019, 06/02/2019    No past medical history on file.  Tobacco History: Social History   Tobacco Use  Smoking Status Never  Smokeless Tobacco Never   Counseling given: Not Answered   Outpatient Medications Prior to Visit  Medication Sig Dispense Refill   amoxicillin-clavulanate (AUGMENTIN) 875-125 MG tablet Take 1 tablet by mouth every 12 (twelve) hours. (Patient not taking: Reported on 06/06/2022) 14 tablet 0   No facility-administered medications prior to visit.      Review of Systems:   Constitutional: No weight loss or gain, night sweats, fevers, chills, or lassitude. +occasional fatigue  HEENT: No headaches, difficulty swallowing, tooth/dental problems, or sore throat. No sneezing, itching, ear ache, nasal congestion, or post nasal drip CV:  No chest pain, orthopnea, PND, swelling in lower extremities, anasarca, dizziness, palpitations, syncope Resp: +loud snoring. No shortness of breath with exertion or at rest. No excess mucus or change in color of mucus. No productive or non-productive. No hemoptysis. No wheezing.  No chest wall deformity GI:  No heartburn, indigestion, abdominal pain, nausea, vomiting, diarrhea, change in bowel habits, loss of appetite, bloody stools.  GU: No dysuria, change in color of urine, urgency or frequency.  Skin: No rash, lesions, ulcerations MSK:  No joint pain or swelling.   Neuro: No dizziness or lightheadedness.  Psych: No depression or anxiety. Mood stable. +sleep disturbance    Physical Exam:  BP (!) 144/82 (BP Location: Right Arm, Patient Position: Sitting, Cuff Size: Normal)   Pulse 93   Temp 98.9 F (37.2 C) (Oral)   Ht  (1.778 m)   Wt 246 lb 12.8 oz (111.9 kg)   SpO2 98%   BMI 35.41 kg/m   GEN: Pleasant, interactive, well-appearing; obese; in no acute distress. HEENT:  Normocephalic and atraumatic. PERRLA. Sclera white. Nasal turbinates pink, moist and patent bilaterally. No rhinorrhea present. Oropharynx pink and moist, without exudate or edema. No lesions, ulcerations, or postnasal drip. Mallampati III NECK:  Supple w/ fair ROM. No JVD present. Normal carotid impulses w/o bruits. Thyroid symmetrical with no goiter or nodules palpated. No lymphadenopathy.   CV: RRR, no m/r/g, no peripheral edema. Pulses intact, +2 bilaterally. No cyanosis, pallor or clubbing. PULMONARY:  Unlabored, regular breathing. Clear bilaterally A&P w/o wheezes/rales/rhonchi. No accessory muscle use.  GI: BS present  and normoactive. Soft, non-tender to palpation. No organomegaly or masses detected.  MSK: No erythema, warmth or tenderness. Cap refil <2 sec all extrem. No deformities or joint swelling noted.  Neuro: A/Ox3. No focal deficits noted.   Skin: Warm, no lesions or rashe Psych: Normal affect and behavior. Judgement and thought content appropriate.     Lab Results:  CBC No results found for: "WBC", "RBC", "HGB", "HCT", "PLT", "MCV", "MCH", "MCHC", "RDW", "LYMPHSABS", "MONOABS", "EOSABS", "BASOSABS"  BMET No results found for: "NA", "K", "CL", "CO2", "GLUCOSE", "BUN", "CREATININE", "CALCIUM", "GFRNONAA", "GFRAA"  BNP No results found for: "BNP"   Imaging:  No results found.        No data to display          No results found for: "NITRICOXIDE"      Assessment & Plan:   Loud snoring He has snoring, daytime sleepiness, restless sleep. BMI 35. Given this,  I am concerned he could have sleep disordered breathing with obstructive sleep apnea. He will need sleep study for further evaluation.    - discussed how weight can impact sleep and risk for sleep disordered breathing - discussed options to assist with weight loss: combination of diet modification, cardiovascular and strength training exercises   - had an extensive discussion regarding the adverse health consequences related to untreated sleep disordered breathing - specifically discussed the risks for hypertension, coronary artery disease, cardiac dysrhythmias, cerebrovascular disease, and diabetes - lifestyle modification discussed   - discussed how sleep disruption can increase risk of accidents, particularly when driving - safe driving practices were discussed  Patient Instructions  Given your symptoms, I am concerned that you may have sleep disordered breathing with sleep apnea. You will need a sleep study for further evaluation. Someone will contact you to schedule this.   We discussed how untreated sleep apnea  puts an individual at risk for cardiac arrhthymias, pulm HTN, DM, stroke and increases their risk for daytime accidents. We also briefly reviewed treatment options including weight loss, side sleeping position, oral appliance, CPAP therapy or referral to ENT for possible surgical options  Use caution when driving and pull over if you become sleepy.  Follow up in 6 weeks with Katie Vandella Ord,NP to go over sleep study results, or sooner, if needed     Daytime hypersomnia See above  Obesity (BMI 30-39.9) BMI 35. Healthy weight loss encouraged.   I spent 35 minutes of dedicated to the care of this patient on the date of this encounter to include pre-visit review of records, face-to-face time with the patient discussing conditions above, post visit ordering of testing, clinical documentation with the electronic health record, making appropriate referrals as documented, and communicating necessary findings to members of the patients care team.  Noemi Chapel, NP 06/08/2022  Pt aware and understands NP's role.

## 2022-06-06 NOTE — Patient Instructions (Signed)
Given your symptoms, I am concerned that you may have sleep disordered breathing with sleep apnea. You will need a sleep study for further evaluation. Someone will contact you to schedule this.   We discussed how untreated sleep apnea puts an individual at risk for cardiac arrhthymias, pulm HTN, DM, stroke and increases their risk for daytime accidents. We also briefly reviewed treatment options including weight loss, side sleeping position, oral appliance, CPAP therapy or referral to ENT for possible surgical options  Use caution when driving and pull over if you become sleepy.  Follow up in 6 weeks with Jonathan Chandler Stofer,NP to go over sleep study results, or sooner, if needed   

## 2022-06-08 ENCOUNTER — Encounter: Payer: Self-pay | Admitting: Nurse Practitioner

## 2022-06-08 DIAGNOSIS — G4710 Hypersomnia, unspecified: Secondary | ICD-10-CM | POA: Insufficient documentation

## 2022-06-08 DIAGNOSIS — R0683 Snoring: Secondary | ICD-10-CM | POA: Insufficient documentation

## 2022-06-08 DIAGNOSIS — E669 Obesity, unspecified: Secondary | ICD-10-CM | POA: Insufficient documentation

## 2022-06-08 NOTE — Assessment & Plan Note (Signed)
BMI 35. Healthy weight loss encouraged.  

## 2022-06-08 NOTE — Assessment & Plan Note (Signed)
He has snoring, daytime sleepiness, restless sleep. BMI 35. Given this,  I am concerned he could have sleep disordered breathing with obstructive sleep apnea. He will need sleep study for further evaluation.    - discussed how weight can impact sleep and risk for sleep disordered breathing - discussed options to assist with weight loss: combination of diet modification, cardiovascular and strength training exercises   - had an extensive discussion regarding the adverse health consequences related to untreated sleep disordered breathing - specifically discussed the risks for hypertension, coronary artery disease, cardiac dysrhythmias, cerebrovascular disease, and diabetes - lifestyle modification discussed   - discussed how sleep disruption can increase risk of accidents, particularly when driving - safe driving practices were discussed  Patient Instructions  Given your symptoms, I am concerned that you may have sleep disordered breathing with sleep apnea. You will need a sleep study for further evaluation. Someone will contact you to schedule this.   We discussed how untreated sleep apnea puts an individual at risk for cardiac arrhthymias, pulm HTN, DM, stroke and increases their risk for daytime accidents. We also briefly reviewed treatment options including weight loss, side sleeping position, oral appliance, CPAP therapy or referral to ENT for possible surgical options  Use caution when driving and pull over if you become sleepy.  Follow up in 6 weeks with Katie Rowe Warman,NP to go over sleep study results, or sooner, if needed

## 2022-06-08 NOTE — Assessment & Plan Note (Signed)
See above

## 2022-06-28 DIAGNOSIS — G4730 Sleep apnea, unspecified: Secondary | ICD-10-CM | POA: Diagnosis not present

## 2022-07-18 ENCOUNTER — Encounter: Payer: Self-pay | Admitting: Nurse Practitioner

## 2022-07-18 ENCOUNTER — Ambulatory Visit (INDEPENDENT_AMBULATORY_CARE_PROVIDER_SITE_OTHER): Payer: 59 | Admitting: Nurse Practitioner

## 2022-07-18 ENCOUNTER — Telehealth: Payer: Self-pay

## 2022-07-18 ENCOUNTER — Telehealth: Payer: Self-pay | Admitting: Pulmonary Disease

## 2022-07-18 ENCOUNTER — Encounter (INDEPENDENT_AMBULATORY_CARE_PROVIDER_SITE_OTHER): Payer: 59

## 2022-07-18 VITALS — BP 130/88 | HR 82 | Temp 98.5°F | Ht 70.0 in | Wt 252.0 lb

## 2022-07-18 DIAGNOSIS — G4733 Obstructive sleep apnea (adult) (pediatric): Secondary | ICD-10-CM | POA: Diagnosis not present

## 2022-07-18 DIAGNOSIS — E669 Obesity, unspecified: Secondary | ICD-10-CM | POA: Diagnosis not present

## 2022-07-18 DIAGNOSIS — R0683 Snoring: Secondary | ICD-10-CM

## 2022-07-18 NOTE — Telephone Encounter (Signed)
Call patient  Sleep study result  Date of study: 06/28/2022  Impression: Severe obstructive sleep apnea Moderate oxygen desaturations  Recommendation: DME referral  Recommend CPAP therapy for severe obstructive sleep apnea  Auto titrating CPAP with pressure settings of 5-20 will be appropriate  Encourage weight loss measures  Follow-up in the office 4 to 6 weeks following initiation of treatment

## 2022-07-18 NOTE — Telephone Encounter (Signed)
Called pt to see if pt did his home sleep study, no answer. LVMM for pt to call back the office

## 2022-07-18 NOTE — Assessment & Plan Note (Signed)
BMI 36. See above

## 2022-07-18 NOTE — Assessment & Plan Note (Signed)
Severe OSA with AHI 61.1/h. We reviewed risks of untreated OSA and potential treatment options. He was agreeable to move forward with CPAP therapy. Orders placed for auto CPAP 5-20 cmH2O with nasal pillow mask. He was educated on proper use/care of device. Risks/benefits reviewed. Cautioned on safe driving practices. Healthy weight loss encouraged.   Patient Instructions  You have severe sleep apnea based on your sleep study results.   Start CPAP 5-20 cmH2O with nasal pillow mask every night, minimum of 4-6 hours a night.  Change equipment every 30 days or as directed by DME. Wash your tubing with warm soap and water daily, hang to dry. Wash humidifier portion weekly. You will need to change the water in your water chamber out daily with bottled, distilled water.  Typical recommended timeframes for changing supplies: -Every month Mask cushions and/or nasal pillows CPAP machine filters -Every 3 months Mask frame (not including the headgear) CPAP tubing -Every 6 months Mask headgear Chin strap (if applicable) Humidifier water tub  Be aware of reduced alertness and do not drive or operate heavy machinery if experiencing this or drowsiness.  Exercise encouraged, as tolerated. Notify if persistent daytime sleepiness occurs even with consistent use of CPAP.  We discussed how untreated sleep apnea puts an individual at risk for cardiac arrhthymias, pulm HTN, DM, stroke and increases their risk for daytime accidents. We also briefly reviewed treatment options including weight loss, side sleeping position, oral appliance, CPAP therapy or referral to ENT for possible surgical options   Follow up in 8-12 weeks with Dr. Wynona Neat or Jonathan Addison Januel Doolan,NP to see how CPAP is going, or sooner, if needed. If you have not heard from a medical supply company in 2 weeks regarding your CPAP, please call our office.

## 2022-07-18 NOTE — Patient Instructions (Addendum)
You have severe sleep apnea based on your sleep study results.   Start CPAP 5-20 cmH2O with nasal pillow mask every night, minimum of 4-6 hours a night.  Change equipment every 30 days or as directed by DME. Wash your tubing with warm soap and water daily, hang to dry. Wash humidifier portion weekly. You will need to change the water in your water chamber out daily with bottled, distilled water.  Typical recommended timeframes for changing supplies: -Every month Mask cushions and/or nasal pillows CPAP machine filters -Every 3 months Mask frame (not including the headgear) CPAP tubing -Every 6 months Mask headgear Chin strap (if applicable) Humidifier water tub  Be aware of reduced alertness and do not drive or operate heavy machinery if experiencing this or drowsiness.  Exercise encouraged, as tolerated. Notify if persistent daytime sleepiness occurs even with consistent use of CPAP.  We discussed how untreated sleep apnea puts an individual at risk for cardiac arrhthymias, pulm HTN, DM, stroke and increases their risk for daytime accidents. We also briefly reviewed treatment options including weight loss, side sleeping position, oral appliance, CPAP therapy or referral to ENT for possible surgical options   Follow up in 8-12 weeks with Jonathan Norton or Jonathan Addison Filmore Molyneux,NP to see how CPAP is going, or sooner, if needed. If you have not heard from a medical supply company in 2 weeks regarding your CPAP, please call our office.

## 2022-07-18 NOTE — Progress Notes (Unsigned)
@Patient  ID: Jonathan Norton, male    DOB: 15-Jan-1971, 52 y.o.   MRN: [id number]  Chief Complaint  Patient presents with   Follow-up    Hst review     Referring provider: No ref. provider found  HPI: 52 year old male, never smoker referred for sleep consult 06/06/2022.  No significant past medical history.  TEST/EVENTS: ' 06/28/2022 HST: AHI 61.1/hr   06/06/2022: OV with Sundus Pete NP for sleep consult.  He has had trouble with his sleep for many years.  He has loud snoring at night, which sometimes awakens him.  He feels like he has restless sleep and tends to wake up tired in the morning.  He feels fatigued some days.  Wakes often throughout the night.  Denies any witnessed apneas, morning headaches, drowsy driving, sleep parasomnia/paralysis.  No history of narcolepsy or symptoms of cataplexy. He goes to bed around 11 PM.  Falls asleep within 20 minutes.  Wakes 4 times at night.  Gets up around 6:30 AM.  No sleep aids.  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  Weight has been stable over the last 2 years.  Never had a previous sleep study.  No significant past medical history He is a never smoker.  Does not drink any alcohol.  Never used drugs.  Occasional caffeine intake with coffee in the morning a few times a week.  He lives with his wife.  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  No significant family history. Ep[place] 2  07/18/2022: Today - follow up Patient presents today for follow up to discuss home sleep study results, which revealed severe OSA.  He continues to feel fatigued during the day.  He has poor sleep quality at night.  He would like to know what potential treatment options he has.  He did try cutting out his morning cup of coffee, but has not seem to notice a huge difference.  Denies any drowsy driving or morning headaches.  No Known Allergies  Immunization History  Administered Date(s) Administered   PFIZER(Purple Top)SARS-COV-2 Vaccination 05/08/2019, 06/02/2019     No past medical history on file.  Tobacco History: Social History   Tobacco Use  Smoking Status Never  Smokeless Tobacco Never   Counseling given: Not Answered   Outpatient Medications Prior to Visit  Medication Sig Dispense Refill   amoxicillin-clavulanate (AUGMENTIN) 875-125 MG tablet Take 1 tablet by mouth every 12 (twelve) hours. (Patient not taking: Reported on 06/06/2022) 14 tablet 0   No facility-administered medications prior to visit.     Review of Systems:   Constitutional: No weight loss or gain, night sweats, fevers, chills, or lassitude. +occasional fatigue  HEENT: No headaches, difficulty swallowing, tooth/dental problems, or sore throat. No sneezing, itching, ear ache, nasal congestion, or post nasal drip CV:  No chest pain, orthopnea, PND, swelling in lower extremities, anasarca, dizziness, palpitations, syncope Resp: +loud snoring. No shortness of breath with exertion or at rest. No excess mucus or change in color of mucus. No productive or non-productive. No hemoptysis. No wheezing.  No chest wall deformity GI:  No heartburn, indigestion, abdominal pain, nausea, vomiting, diarrhea, change in bowel habits, loss of appetite, bloody stools.  GU: No dysuria, change in color of urine, urgency or frequency.  Skin: No rash, lesions, ulcerations MSK:  No joint pain or swelling.   Neuro: No dizziness or lightheadedness.  Psych: No depression or anxiety. Mood stable. +sleep disturbance    Physical Exam:  BP 130/88  Pulse 82   Temp 98.5 F (36.9 C) (Oral)   Ht 5\' 10"  (1.778 m)   Wt 252 lb (114.3 kg)   SpO2 97%   BMI 36.16 kg/m   GEN: Pleasant, interactive, well-appearing; obese; in no acute distress. HEENT:  Normocephalic and atraumatic. PERRLA. Sclera white. Nasal turbinates pink, moist and patent bilaterally. No rhinorrhea present. Oropharynx pink and moist, without exudate or edema. No lesions, ulcerations, or postnasal drip. Mallampati III NECK:   Supple w/ fair ROM. No JVD present. Normal carotid impulses w/o bruits. Thyroid symmetrical with no goiter or nodules palpated. No lymphadenopathy.   CV: RRR, no m/r/g, no peripheral edema. Pulses intact, +2 bilaterally. No cyanosis, pallor or clubbing. PULMONARY:  Unlabored, regular breathing. Clear bilaterally A&P w/o wheezes/rales/rhonchi. No accessory muscle use.  GI: BS present and normoactive. Soft, non-tender to palpation. No organomegaly or masses detected.  MSK: No erythema, warmth or tenderness. Cap refil <2 sec all extrem. No deformities or joint swelling noted.  Neuro: A/Ox3. No focal deficits noted.   Skin: Warm, no lesions or rashe Psych: Normal affect and behavior. Judgement and thought content appropriate.     Lab Results:  CBC No results found for: "WBC", "RBC", "HGB", "HCT", "PLT", "MCV", "MCH", "MCHC", "RDW", "LYMPHSABS", "MONOABS", "EOSABS", "BASOSABS"  BMET No results found for: "NA", "K", "CL", "CO2", "GLUCOSE", "BUN", "CREATININE", "CALCIUM", "GFRNONAA", "GFRAA"  BNP No results found for: "BNP"   Imaging:  No results found.        No data to display          No results found for: "NITRICOXIDE"      Assessment & Plan:   Severe obstructive sleep apnea Severe OSA with AHI 61.1/h. We reviewed risks of untreated OSA and potential treatment options. He was agreeable to move forward with CPAP therapy. Orders placed for auto CPAP 5-20 cmH2O with nasal pillow mask. He was educated on proper use/care of device. Risks/benefits reviewed. Cautioned on safe driving practices. Healthy weight loss encouraged.   Patient Instructions  You have severe sleep apnea based on your sleep study results.   Start CPAP 5-20 cmH2O with nasal pillow mask every night, minimum of 4-6 hours a night.  Change equipment every 30 days or as directed by DME. Wash your tubing with warm soap and water daily, hang to dry. Wash humidifier portion weekly. You will need to change the  water in your water chamber out daily with bottled, distilled water.  Typical recommended timeframes for changing supplies: -Every month Mask cushions and/or nasal pillows CPAP machine filters -Every 3 months Mask frame (not including the headgear) CPAP tubing -Every 6 months Mask headgear Chin strap (if applicable) Humidifier water tub  Be aware of reduced alertness and do not drive or operate heavy machinery if experiencing this or drowsiness.  Exercise encouraged, as tolerated. Notify if persistent daytime sleepiness occurs even with consistent use of CPAP.  We discussed how untreated sleep apnea puts an individual at risk for cardiac arrhthymias, pulm HTN, DM, stroke and increases their risk for daytime accidents. We also briefly reviewed treatment options including weight loss, side sleeping position, oral appliance, CPAP therapy or referral to ENT for possible surgical options   Follow up in 8-12 weeks with Dr. Wynona Neat or Florentina Addison [place] Yarbro,NP to see how CPAP is going, or sooner, if needed. If you have not heard from a medical supply company in 2 weeks regarding your CPAP, please call our office.    Obesity (BMI 30-39.9) BMI 36. See above  I spent 35 minutes of dedicated to the care of this patient on the date of this encounter to include pre-visit review of records, face-to-face time with the patient discussing conditions above, post visit ordering of testing, clinical documentation with the electronic health record, making appropriate referrals as documented, and communicating necessary findings to members of the patients care team.  Noemi Chapel, NP 07/18/2022  Pt aware and understands NP's role.

## 2022-07-19 ENCOUNTER — Other Ambulatory Visit: Payer: Self-pay

## 2022-07-19 DIAGNOSIS — R0683 Snoring: Secondary | ICD-10-CM

## 2022-07-20 NOTE — Telephone Encounter (Signed)
Patient has seen Katie for a office visit and  CPAP was ordered.

## 2022-07-25 DIAGNOSIS — G4733 Obstructive sleep apnea (adult) (pediatric): Secondary | ICD-10-CM | POA: Diagnosis not present

## 2022-08-24 DIAGNOSIS — G4733 Obstructive sleep apnea (adult) (pediatric): Secondary | ICD-10-CM | POA: Diagnosis not present

## 2022-09-05 DIAGNOSIS — U071 COVID-19: Secondary | ICD-10-CM | POA: Diagnosis not present

## 2022-09-05 DIAGNOSIS — J0110 Acute frontal sinusitis, unspecified: Secondary | ICD-10-CM | POA: Diagnosis not present

## 2022-09-05 DIAGNOSIS — G44201 Tension-type headache, unspecified, intractable: Secondary | ICD-10-CM | POA: Diagnosis not present

## 2022-09-24 DIAGNOSIS — G4733 Obstructive sleep apnea (adult) (pediatric): Secondary | ICD-10-CM | POA: Diagnosis not present

## 2022-10-25 DIAGNOSIS — G4733 Obstructive sleep apnea (adult) (pediatric): Secondary | ICD-10-CM | POA: Diagnosis not present

## 2022-11-13 DIAGNOSIS — E7800 Pure hypercholesterolemia, unspecified: Secondary | ICD-10-CM | POA: Diagnosis not present

## 2022-11-13 DIAGNOSIS — Z9189 Other specified personal risk factors, not elsewhere classified: Secondary | ICD-10-CM | POA: Diagnosis not present

## 2022-11-13 DIAGNOSIS — E6609 Other obesity due to excess calories: Secondary | ICD-10-CM | POA: Diagnosis not present

## 2022-11-13 DIAGNOSIS — Z131 Encounter for screening for diabetes mellitus: Secondary | ICD-10-CM | POA: Diagnosis not present

## 2022-11-24 DIAGNOSIS — G4733 Obstructive sleep apnea (adult) (pediatric): Secondary | ICD-10-CM | POA: Diagnosis not present

## 2022-11-27 DIAGNOSIS — E6609 Other obesity due to excess calories: Secondary | ICD-10-CM | POA: Diagnosis not present

## 2022-11-27 DIAGNOSIS — Z23 Encounter for immunization: Secondary | ICD-10-CM | POA: Diagnosis not present

## 2022-11-27 DIAGNOSIS — E7800 Pure hypercholesterolemia, unspecified: Secondary | ICD-10-CM | POA: Diagnosis not present

## 2023-05-08 DIAGNOSIS — Z1329 Encounter for screening for other suspected endocrine disorder: Secondary | ICD-10-CM | POA: Diagnosis not present

## 2023-05-08 DIAGNOSIS — Z0001 Encounter for general adult medical examination with abnormal findings: Secondary | ICD-10-CM | POA: Diagnosis not present

## 2023-05-08 DIAGNOSIS — Z6836 Body mass index (BMI) 36.0-36.9, adult: Secondary | ICD-10-CM | POA: Diagnosis not present

## 2023-05-08 DIAGNOSIS — E782 Mixed hyperlipidemia: Secondary | ICD-10-CM | POA: Diagnosis not present

## 2023-05-08 DIAGNOSIS — E66812 Obesity, class 2: Secondary | ICD-10-CM | POA: Diagnosis not present

## 2023-05-08 DIAGNOSIS — Z136 Encounter for screening for cardiovascular disorders: Secondary | ICD-10-CM | POA: Diagnosis not present

## 2023-05-08 DIAGNOSIS — Z125 Encounter for screening for malignant neoplasm of prostate: Secondary | ICD-10-CM | POA: Diagnosis not present

## 2023-05-08 DIAGNOSIS — R635 Abnormal weight gain: Secondary | ICD-10-CM | POA: Diagnosis not present

## 2023-05-08 DIAGNOSIS — Z131 Encounter for screening for diabetes mellitus: Secondary | ICD-10-CM | POA: Diagnosis not present

## 2023-05-28 DIAGNOSIS — Z6836 Body mass index (BMI) 36.0-36.9, adult: Secondary | ICD-10-CM | POA: Diagnosis not present

## 2023-05-28 DIAGNOSIS — E66812 Obesity, class 2: Secondary | ICD-10-CM | POA: Diagnosis not present

## 2023-05-28 DIAGNOSIS — J302 Other seasonal allergic rhinitis: Secondary | ICD-10-CM | POA: Diagnosis not present

## 2023-05-28 DIAGNOSIS — E782 Mixed hyperlipidemia: Secondary | ICD-10-CM | POA: Diagnosis not present

## 2023-09-24 DIAGNOSIS — K219 Gastro-esophageal reflux disease without esophagitis: Secondary | ICD-10-CM | POA: Diagnosis not present

## 2023-09-24 DIAGNOSIS — R1012 Left upper quadrant pain: Secondary | ICD-10-CM | POA: Diagnosis not present

## 2023-09-24 DIAGNOSIS — E782 Mixed hyperlipidemia: Secondary | ICD-10-CM | POA: Diagnosis not present

## 2023-09-24 DIAGNOSIS — E66812 Obesity, class 2: Secondary | ICD-10-CM | POA: Diagnosis not present

## 2023-09-24 DIAGNOSIS — J302 Other seasonal allergic rhinitis: Secondary | ICD-10-CM | POA: Diagnosis not present

## 2023-09-24 DIAGNOSIS — Z6836 Body mass index (BMI) 36.0-36.9, adult: Secondary | ICD-10-CM | POA: Diagnosis not present

## 2023-10-09 DIAGNOSIS — J302 Other seasonal allergic rhinitis: Secondary | ICD-10-CM | POA: Diagnosis not present

## 2023-10-09 DIAGNOSIS — E66812 Obesity, class 2: Secondary | ICD-10-CM | POA: Diagnosis not present

## 2023-10-09 DIAGNOSIS — E782 Mixed hyperlipidemia: Secondary | ICD-10-CM | POA: Diagnosis not present

## 2023-10-09 DIAGNOSIS — K219 Gastro-esophageal reflux disease without esophagitis: Secondary | ICD-10-CM | POA: Diagnosis not present

## 2023-10-09 DIAGNOSIS — Z6836 Body mass index (BMI) 36.0-36.9, adult: Secondary | ICD-10-CM | POA: Diagnosis not present

## 2023-11-26 DIAGNOSIS — E782 Mixed hyperlipidemia: Secondary | ICD-10-CM | POA: Diagnosis not present

## 2023-11-26 DIAGNOSIS — R0789 Other chest pain: Secondary | ICD-10-CM | POA: Diagnosis not present

## 2023-11-26 DIAGNOSIS — K219 Gastro-esophageal reflux disease without esophagitis: Secondary | ICD-10-CM | POA: Diagnosis not present

## 2023-11-26 DIAGNOSIS — Z6836 Body mass index (BMI) 36.0-36.9, adult: Secondary | ICD-10-CM | POA: Diagnosis not present

## 2023-11-26 DIAGNOSIS — J302 Other seasonal allergic rhinitis: Secondary | ICD-10-CM | POA: Diagnosis not present

## 2024-01-07 DIAGNOSIS — E782 Mixed hyperlipidemia: Secondary | ICD-10-CM | POA: Diagnosis not present

## 2024-01-07 DIAGNOSIS — Z6836 Body mass index (BMI) 36.0-36.9, adult: Secondary | ICD-10-CM | POA: Diagnosis not present

## 2024-01-07 DIAGNOSIS — J302 Other seasonal allergic rhinitis: Secondary | ICD-10-CM | POA: Diagnosis not present

## 2024-01-07 DIAGNOSIS — K219 Gastro-esophageal reflux disease without esophagitis: Secondary | ICD-10-CM | POA: Diagnosis not present
